# Patient Record
Sex: Male | Born: 1975 | Race: White | Hispanic: No | Marital: Married | Smoking: Never smoker
Health system: Southern US, Community
[De-identification: ages and names within clinical notes are randomized; demographics above are authoritative.]

## PROBLEM LIST (undated history)

## (undated) DIAGNOSIS — K649 Unspecified hemorrhoids: Secondary | ICD-10-CM

## (undated) DIAGNOSIS — F32A Depression, unspecified: Secondary | ICD-10-CM

## (undated) DIAGNOSIS — F329 Major depressive disorder, single episode, unspecified: Secondary | ICD-10-CM

## (undated) DIAGNOSIS — E785 Hyperlipidemia, unspecified: Secondary | ICD-10-CM

## (undated) DIAGNOSIS — J45909 Unspecified asthma, uncomplicated: Secondary | ICD-10-CM

## (undated) DIAGNOSIS — T7840XA Allergy, unspecified, initial encounter: Secondary | ICD-10-CM

## (undated) DIAGNOSIS — K625 Hemorrhage of anus and rectum: Secondary | ICD-10-CM

## (undated) HISTORY — DX: Allergy, unspecified, initial encounter: T78.40XA

## (undated) HISTORY — DX: Unspecified hemorrhoids: K64.9

## (undated) HISTORY — PX: OTHER SURGICAL HISTORY: SHX169

## (undated) HISTORY — PX: WISDOM TOOTH EXTRACTION: SHX21

## (undated) HISTORY — DX: Hyperlipidemia, unspecified: E78.5

## (undated) HISTORY — DX: Depression, unspecified: F32.A

## (undated) HISTORY — DX: Unspecified asthma, uncomplicated: J45.909

## (undated) HISTORY — DX: Hemorrhage of anus and rectum: K62.5

## (undated) HISTORY — DX: Major depressive disorder, single episode, unspecified: F32.9

---

## 2007-08-27 ENCOUNTER — Emergency Department (HOSPITAL_COMMUNITY): Admission: EM | Admit: 2007-08-27 | Discharge: 2007-08-27 | Payer: Self-pay | Admitting: Family Medicine

## 2007-10-25 ENCOUNTER — Emergency Department (HOSPITAL_COMMUNITY): Admission: EM | Admit: 2007-10-25 | Discharge: 2007-10-25 | Payer: Self-pay | Admitting: Family Medicine

## 2008-03-16 ENCOUNTER — Emergency Department (HOSPITAL_COMMUNITY): Admission: EM | Admit: 2008-03-16 | Discharge: 2008-03-16 | Payer: Self-pay | Admitting: Family Medicine

## 2009-01-27 ENCOUNTER — Encounter: Admission: RE | Admit: 2009-01-27 | Discharge: 2009-01-27 | Payer: Self-pay | Admitting: Chiropractic Medicine

## 2010-03-06 ENCOUNTER — Ambulatory Visit: Payer: Self-pay | Admitting: Psychology

## 2011-07-01 LAB — INFLUENZA A AND B ANTIGEN (CONVERTED LAB): Influenza B Ag: NEGATIVE

## 2012-08-14 ENCOUNTER — Ambulatory Visit (INDEPENDENT_AMBULATORY_CARE_PROVIDER_SITE_OTHER): Payer: 59 | Admitting: Psychology

## 2012-08-14 DIAGNOSIS — F411 Generalized anxiety disorder: Secondary | ICD-10-CM

## 2012-08-21 ENCOUNTER — Ambulatory Visit: Payer: Self-pay | Admitting: Psychology

## 2012-08-22 ENCOUNTER — Ambulatory Visit (INDEPENDENT_AMBULATORY_CARE_PROVIDER_SITE_OTHER): Payer: 59 | Admitting: Psychology

## 2012-08-22 DIAGNOSIS — F411 Generalized anxiety disorder: Secondary | ICD-10-CM

## 2012-08-24 ENCOUNTER — Ambulatory Visit (INDEPENDENT_AMBULATORY_CARE_PROVIDER_SITE_OTHER): Payer: 59 | Admitting: Psychology

## 2012-08-24 DIAGNOSIS — F411 Generalized anxiety disorder: Secondary | ICD-10-CM

## 2012-09-13 ENCOUNTER — Ambulatory Visit (INDEPENDENT_AMBULATORY_CARE_PROVIDER_SITE_OTHER): Payer: 59 | Admitting: Psychology

## 2012-09-13 DIAGNOSIS — F411 Generalized anxiety disorder: Secondary | ICD-10-CM

## 2012-09-28 ENCOUNTER — Ambulatory Visit (INDEPENDENT_AMBULATORY_CARE_PROVIDER_SITE_OTHER): Payer: 59 | Admitting: Psychology

## 2012-09-28 DIAGNOSIS — F411 Generalized anxiety disorder: Secondary | ICD-10-CM

## 2012-11-28 ENCOUNTER — Ambulatory Visit (INDEPENDENT_AMBULATORY_CARE_PROVIDER_SITE_OTHER): Payer: 59 | Admitting: Psychology

## 2012-11-28 DIAGNOSIS — F411 Generalized anxiety disorder: Secondary | ICD-10-CM

## 2012-12-19 ENCOUNTER — Ambulatory Visit (INDEPENDENT_AMBULATORY_CARE_PROVIDER_SITE_OTHER): Payer: 59 | Admitting: Psychology

## 2012-12-19 DIAGNOSIS — F411 Generalized anxiety disorder: Secondary | ICD-10-CM

## 2012-12-28 ENCOUNTER — Ambulatory Visit (INDEPENDENT_AMBULATORY_CARE_PROVIDER_SITE_OTHER): Payer: BC Managed Care – PPO | Admitting: Psychology

## 2012-12-28 DIAGNOSIS — F411 Generalized anxiety disorder: Secondary | ICD-10-CM

## 2013-01-05 ENCOUNTER — Ambulatory Visit (INDEPENDENT_AMBULATORY_CARE_PROVIDER_SITE_OTHER): Payer: BC Managed Care – PPO | Admitting: Psychology

## 2013-01-05 DIAGNOSIS — F411 Generalized anxiety disorder: Secondary | ICD-10-CM

## 2013-01-23 ENCOUNTER — Ambulatory Visit (INDEPENDENT_AMBULATORY_CARE_PROVIDER_SITE_OTHER): Payer: 59 | Admitting: Psychology

## 2013-01-23 DIAGNOSIS — F411 Generalized anxiety disorder: Secondary | ICD-10-CM

## 2013-02-20 ENCOUNTER — Ambulatory Visit (INDEPENDENT_AMBULATORY_CARE_PROVIDER_SITE_OTHER): Payer: 59 | Admitting: Psychology

## 2013-02-20 DIAGNOSIS — F411 Generalized anxiety disorder: Secondary | ICD-10-CM

## 2013-02-22 ENCOUNTER — Ambulatory Visit (INDEPENDENT_AMBULATORY_CARE_PROVIDER_SITE_OTHER): Payer: 59 | Admitting: Psychology

## 2013-02-22 DIAGNOSIS — F411 Generalized anxiety disorder: Secondary | ICD-10-CM

## 2013-03-02 ENCOUNTER — Ambulatory Visit: Payer: 59 | Admitting: Psychology

## 2013-03-06 ENCOUNTER — Ambulatory Visit (INDEPENDENT_AMBULATORY_CARE_PROVIDER_SITE_OTHER): Payer: 59 | Admitting: Psychology

## 2013-03-06 DIAGNOSIS — F411 Generalized anxiety disorder: Secondary | ICD-10-CM

## 2013-03-08 ENCOUNTER — Ambulatory Visit: Payer: 59 | Admitting: Psychology

## 2013-05-15 ENCOUNTER — Ambulatory Visit (INDEPENDENT_AMBULATORY_CARE_PROVIDER_SITE_OTHER): Payer: BC Managed Care – PPO | Admitting: Psychology

## 2013-05-15 DIAGNOSIS — F411 Generalized anxiety disorder: Secondary | ICD-10-CM

## 2013-05-23 ENCOUNTER — Ambulatory Visit (INDEPENDENT_AMBULATORY_CARE_PROVIDER_SITE_OTHER): Payer: BC Managed Care – PPO | Admitting: Psychology

## 2013-05-23 DIAGNOSIS — F411 Generalized anxiety disorder: Secondary | ICD-10-CM

## 2013-05-31 ENCOUNTER — Ambulatory Visit (INDEPENDENT_AMBULATORY_CARE_PROVIDER_SITE_OTHER): Payer: BC Managed Care – PPO | Admitting: Psychology

## 2013-05-31 DIAGNOSIS — F411 Generalized anxiety disorder: Secondary | ICD-10-CM

## 2013-06-06 ENCOUNTER — Ambulatory Visit (INDEPENDENT_AMBULATORY_CARE_PROVIDER_SITE_OTHER): Payer: BC Managed Care – PPO | Admitting: Psychology

## 2013-06-06 DIAGNOSIS — F411 Generalized anxiety disorder: Secondary | ICD-10-CM

## 2013-06-13 ENCOUNTER — Ambulatory Visit (INDEPENDENT_AMBULATORY_CARE_PROVIDER_SITE_OTHER): Payer: BC Managed Care – PPO | Admitting: Psychology

## 2013-06-13 DIAGNOSIS — F411 Generalized anxiety disorder: Secondary | ICD-10-CM

## 2013-07-04 ENCOUNTER — Ambulatory Visit (INDEPENDENT_AMBULATORY_CARE_PROVIDER_SITE_OTHER): Payer: BC Managed Care – PPO | Admitting: Psychology

## 2013-07-04 DIAGNOSIS — F411 Generalized anxiety disorder: Secondary | ICD-10-CM

## 2013-07-09 ENCOUNTER — Ambulatory Visit (INDEPENDENT_AMBULATORY_CARE_PROVIDER_SITE_OTHER): Payer: BC Managed Care – PPO | Admitting: Psychology

## 2013-07-09 DIAGNOSIS — F411 Generalized anxiety disorder: Secondary | ICD-10-CM

## 2013-07-18 ENCOUNTER — Ambulatory Visit (INDEPENDENT_AMBULATORY_CARE_PROVIDER_SITE_OTHER): Payer: BC Managed Care – PPO | Admitting: Psychology

## 2013-07-18 DIAGNOSIS — F411 Generalized anxiety disorder: Secondary | ICD-10-CM

## 2013-07-26 ENCOUNTER — Ambulatory Visit (INDEPENDENT_AMBULATORY_CARE_PROVIDER_SITE_OTHER): Payer: BC Managed Care – PPO | Admitting: Psychology

## 2013-07-26 DIAGNOSIS — F411 Generalized anxiety disorder: Secondary | ICD-10-CM

## 2013-09-10 ENCOUNTER — Ambulatory Visit (INDEPENDENT_AMBULATORY_CARE_PROVIDER_SITE_OTHER): Payer: BC Managed Care – PPO | Admitting: Psychology

## 2013-09-10 DIAGNOSIS — F411 Generalized anxiety disorder: Secondary | ICD-10-CM

## 2016-09-13 ENCOUNTER — Encounter (INDEPENDENT_AMBULATORY_CARE_PROVIDER_SITE_OTHER): Payer: Self-pay

## 2016-12-06 DIAGNOSIS — M25551 Pain in right hip: Secondary | ICD-10-CM | POA: Diagnosis not present

## 2016-12-06 DIAGNOSIS — M545 Low back pain: Secondary | ICD-10-CM | POA: Diagnosis not present

## 2017-01-22 DIAGNOSIS — T148XXA Other injury of unspecified body region, initial encounter: Secondary | ICD-10-CM | POA: Diagnosis not present

## 2017-04-14 DIAGNOSIS — R5381 Other malaise: Secondary | ICD-10-CM | POA: Diagnosis not present

## 2017-05-16 ENCOUNTER — Encounter: Payer: Self-pay | Admitting: Neurology

## 2017-05-16 ENCOUNTER — Ambulatory Visit (INDEPENDENT_AMBULATORY_CARE_PROVIDER_SITE_OTHER): Payer: 59 | Admitting: Neurology

## 2017-05-16 VITALS — BP 123/80 | HR 90 | Ht 71.0 in | Wt 179.0 lb

## 2017-05-16 DIAGNOSIS — R0681 Apnea, not elsewhere classified: Secondary | ICD-10-CM | POA: Diagnosis not present

## 2017-05-16 DIAGNOSIS — J4520 Mild intermittent asthma, uncomplicated: Secondary | ICD-10-CM

## 2017-05-16 DIAGNOSIS — G4719 Other hypersomnia: Secondary | ICD-10-CM

## 2017-05-16 DIAGNOSIS — R0683 Snoring: Secondary | ICD-10-CM | POA: Diagnosis not present

## 2017-05-16 DIAGNOSIS — J302 Other seasonal allergic rhinitis: Secondary | ICD-10-CM | POA: Diagnosis not present

## 2017-05-16 NOTE — Progress Notes (Signed)
Subjective:    Patient ID: Kevin Salazar is a 41 y.o. male.  HPI     Star Age, MD, PhD [facility] [address] [place], [place] [address]  Dear Dr. Osborne Casco,   I saw your patient, Kevin Salazar, upon your kind request in my neurologic clinic today for initial consultation of his sleep disorder, in particular, concern for underlying obstructive sleep apnea. The patient is unaccompanied today. As you know, [name] is a 41 year old right-handed gentleman with an underlying medical history of depression, asthma, borderline overweight state, who reports snoring and excessive daytime somnolence. I reviewed your office note from 04/14/2017, which you kindly included. His Epworth sleepiness score is 16 out of 24 today, fatigue score is 37 of 63. He does not typically wake up rested. He lives at home with his wife and 3 children. He owns fitness clubs. He is a nonsmoker, drinks alcohol occasionally, perhaps on the weekend, drinks caffeine in the form of coffee and tea,, on a day-to-day basis typically 1 cup of coffee in the morning and occasional sweet tea. He does not drink sodas. He is not aware of any family history of OSA. He denies any telltale symptoms of restless leg syndrome or leg twitching at night, has been told that he talks in his sleep per wife. She has also witnessed apneic breathing pauses while asleep. His main complaint is daytime somnolence and not waking up rested. He does not have any night to night nocturia or morning headaches. Bedtime is generally around midnight, wakeup time currently around 7:30 AM. He has been known to snore for years per wife's report. His children are 66, 30, and 20 years old. His asthma is generally speaking well controlled, he suffers from seasonal allergies as well.  His Past Medical History Is Significant For: Past Medical History:  Diagnosis Date  . Asthma   . Depression     His Past Surgical History  Is Significant For: No past surgical history on file.  His Family History Is Significant For: Family History  Problem Relation Age of Onset  . Breast cancer Mother     His Social History Is Significant For: Social History   Social History  . Marital status: Married    Spouse name: N/A  . Number of children: N/A  . Years of education: N/A   Social History Main Topics  . Smoking status: Never Smoker  . Smokeless tobacco: Never Used  . Alcohol use No  . Drug use: No  . Sexual activity: Not Asked   Other Topics Concern  . None   Social History Narrative  . None    His Allergies Are:  No Known Allergies:   His Current Medications Are:  Outpatient Encounter Prescriptions as of 05/16/2017  Medication Sig  . albuterol (PROVENTIL HFA;VENTOLIN HFA) 108 (90 Base) MCG/ACT inhaler Inhale into the lungs every 6 (six) hours as needed for wheezing or shortness of breath.  . Azelastine-Fluticasone (DYMISTA) 137-50 MCG/ACT SUSP Place into the nose.  Marland Kitchen desvenlafaxine (PRISTIQ) 50 MG 24 hr tablet Take 50 mg by mouth daily.  . Fluticasone-Salmeterol (ADVAIR) 500-50 MCG/DOSE AEPB Inhale 1 puff into the lungs 2 (two) times daily.   No facility-administered encounter medications on file as of 05/16/2017.   :  Review of Systems:  Out of a complete 14 point review of systems, all are reviewed and negative with the exception of these symptoms as listed below:  Review of Systems  Neurological:  Pt presents today to discuss his sleep. Pt has never had a sleep study but does endorse snoring.  Epworth Sleepiness Scale 0= would never doze 1= slight chance of dozing 2= moderate chance of dozing 3= high chance of dozing  Sitting and reading: 3 Watching TV: 3 Sitting inactive in a public place (ex. Theater or meeting): 3 As a passenger in a car for an hour without a break: 3 Lying down to rest in the afternoon: 3 Sitting and talking to someone: 0 Sitting quietly after lunch (no  alcohol): 1 In a car, while stopped in traffic: 0 Total: 16     Objective:  Neurological Exam  Physical Exam Physical Examination:   Vitals:   05/16/17 1546  BP: 123/80  Pulse: 90    General Examination: The patient is a very pleasant 41 y.o. male in no acute distress. He appears well-developed and well-nourished and well groomed.   HEENT: Normocephalic, atraumatic, pupils are equal, round and reactive to light and accommodation. Funduscopic exam is normal with sharp disc margins noted. Extraocular tracking is good without limitation to gaze excursion or nystagmus noted. Normal smooth pursuit is noted. Hearing is grossly intact. Tympanic membranes are clear bilaterally. Face is symmetric with normal facial animation and normal facial sensation. Speech is clear with no dysarthria noted. There is no hypophonia. There is no lip, neck/head, jaw or voice tremor. Neck is supple with full range of passive and active motion. There are no carotid bruits on auscultation. Oropharynx exam reveals: mild mouth dryness, good dental hygiene and mild airway crowding, due to smaller airway and Redundant soft palate, uvula appears to be relatively speaking a little larger, tonsils are in place but small. Mallampati is class III. Neck circumference is 16-1/2 inches. He has a minimal overbite.  Chest: Clear to auscultation without wheezing, rhonchi or crackles noted.  Heart: S1+S2+0, regular and normal without murmurs, rubs or gallops noted.   Abdomen: Soft, non-tender and non-distended with normal bowel sounds appreciated on auscultation.  Extremities: There is no pitting edema in the distal lower extremities bilaterally. Pedal pulses are intact.  Skin: Warm and dry without trophic changes noted. There are no varicose veins.  Musculoskeletal: exam reveals no obvious joint deformities, tenderness or joint swelling or erythema.   Neurologically:  Mental status: The patient is awake, alert and oriented  in all 4 spheres. His immediate and remote memory, attention, language skills and fund of knowledge are appropriate. There is no evidence of aphasia, agnosia, apraxia or anomia. Speech is clear with normal prosody and enunciation. Thought process is linear. Mood is normal and affect is normal.  Cranial nerves II - XII are as described above under HEENT exam. In addition: shoulder shrug is normal with equal shoulder height noted. Motor exam: Normal bulk, strength and tone is noted. There is no drift, tremor or rebound. Romberg is negative. Reflexes are 1-2+ throughout. Fine motor skills and coordination: intact with normal finger taps, normal hand movements, normal rapid alternating patting, normal foot taps and normal foot agility.  Cerebellar testing: No dysmetria or intention tremor on finger to nose testing. Heel to shin is unremarkable bilaterally. There is no truncal or gait ataxia.  Sensory exam: intact to light touch in the upper and lower extremities.  Gait, station and balance: He stands easily. No veering to one side is noted. No leaning to one side is noted. Posture is age-appropriate and stance is narrow based. Gait shows normal stride length and normal pace. No problems turning  are noted. Tandem walk is unremarkable.  Assessment and Plan:   In summary, JMARION CHRISTIANO is a very pleasant 41 y.o.-year old male  with an underlying medical history of depression, asthma, borderline overweight state, whose history and physical exam are concerning for obstructive sleep apnea (OSA). I had a long chat with the patient about my findings and the diagnosis of OSA, its prognosis and treatment options. We talked about medical treatments, surgical interventions and non-pharmacological approaches. I explained in particular the risks and ramifications of untreated moderate to severe OSA, especially with respect to developing cardiovascular disease down the Road, including congestive heart failure, difficult to  treat hypertension, cardiac arrhythmias, or stroke. Even type 2 diabetes has, in part, been linked to untreated OSA. Symptoms of untreated OSA include daytime sleepiness, memory problems, mood irritability and mood disorder such as depression and anxiety, lack of energy, as well as recurrent headaches, especially morning headaches. We talked about trying to maintain a healthy lifestyle in general, as well as the importance of weight control. I encouraged the patient to eat healthy, exercise daily and keep well hydrated, to keep a scheduled bedtime and wake time routine, to not skip any meals and eat healthy snacks in between meals. I advised the patient not to drive when feeling sleepy. I recommended the following at this time: sleep study with potential positive airway pressure titration. (We will score hypopneas at 4%).   I explained the sleep test procedure to the patient and also outlined possible surgical and non-surgical treatment options of OSA, including the use of a custom-made dental device (which would require a referral to a specialist dentist or oral surgeon), upper airway surgical options, such as pillar implants, radiofrequency surgery, tongue base surgery, and UPPP (which would involve a referral to an ENT surgeon). Rarely, jaw surgery such as mandibular advancement may be considered.  I also explained the CPAP treatment option to the patient, who indicated that he would be willing to try CPAP if the need arises. I explained the importance of being compliant with PAP treatment, not only for insurance purposes but primarily to improve His symptoms, and for the patient's long term health benefit, including to reduce His cardiovascular risks. I answered all his questions today and the patient was in agreement. I would like to see him back after the sleep study is completed and encouraged him to call with any interim questions, concerns, problems or updates.   Thank you very much for allowing me  to participate in the care of this nice patient. If I can be of any further assistance to you please do not hesitate to call me at [phone].  Sincerely,   Star Age, MD, PhD

## 2017-05-16 NOTE — Patient Instructions (Addendum)
Based on your symptoms and your exam I believe you are at risk for obstructive sleep apnea or OSA, and I think we should proceed with a sleep study to determine whether you do or do not have OSA and how severe it is. If you have more than mild OSA, I want you to consider treatment with CPAP. Please remember, the risks and ramifications of moderate to severe obstructive sleep apnea or OSA are: Cardiovascular disease, including congestive heart failure, stroke, difficult to control hypertension, arrhythmias, and even type 2 diabetes has been linked to untreated OSA. Sleep apnea causes disruption of sleep and sleep deprivation in most cases, which, in turn, can cause recurrent headaches, problems with memory, mood, concentration, focus, and vigilance. Most people with untreated sleep apnea report excessive daytime sleepiness, which can affect their ability to drive. Please do not drive if you feel sleepy.   I will likely see you back after your sleep study to go over the test results and where to go from there. We will call you after your sleep study to advise about the results (most likely, you will hear from Diana, my nurse) and to set up an appointment at the time, as necessary.    Our sleep lab administrative assistant, Dawn will meet with you or call you to schedule your sleep study. If you don't hear back from her by about 2 weeks, please feel free to call her at [phone]. This is her direct line and please leave a message with your phone number to call back if you get the voicemail box. She will call back as soon as possible.   

## 2017-05-23 ENCOUNTER — Telehealth: Payer: Self-pay | Admitting: Neurology

## 2017-05-23 DIAGNOSIS — R0681 Apnea, not elsewhere classified: Secondary | ICD-10-CM

## 2017-05-23 NOTE — Telephone Encounter (Signed)
Per SA, ok for HST. Order in EPIC/fim

## 2017-05-23 NOTE — Telephone Encounter (Signed)
[employer] denied Split sleep and approved HST.  Can I get an order for HST? °

## 2017-06-20 ENCOUNTER — Ambulatory Visit (INDEPENDENT_AMBULATORY_CARE_PROVIDER_SITE_OTHER): Payer: 59 | Admitting: Neurology

## 2017-06-20 DIAGNOSIS — G4733 Obstructive sleep apnea (adult) (pediatric): Secondary | ICD-10-CM

## 2017-06-20 DIAGNOSIS — R0681 Apnea, not elsewhere classified: Secondary | ICD-10-CM

## 2017-06-21 DIAGNOSIS — Z0000 Encounter for general adult medical examination without abnormal findings: Secondary | ICD-10-CM | POA: Diagnosis not present

## 2017-06-22 ENCOUNTER — Telehealth: Payer: Self-pay

## 2017-06-22 DIAGNOSIS — H1045 Other chronic allergic conjunctivitis: Secondary | ICD-10-CM | POA: Diagnosis not present

## 2017-06-22 DIAGNOSIS — Z1389 Encounter for screening for other disorder: Secondary | ICD-10-CM | POA: Diagnosis not present

## 2017-06-22 DIAGNOSIS — J4530 Mild persistent asthma, uncomplicated: Secondary | ICD-10-CM | POA: Diagnosis not present

## 2017-06-22 DIAGNOSIS — Z0000 Encounter for general adult medical examination without abnormal findings: Secondary | ICD-10-CM | POA: Diagnosis not present

## 2017-06-22 NOTE — Telephone Encounter (Signed)
Pt returned my call. I advised him that his sleep study revealed mild osa and that Dr. Rexene Alberts recommends that pt start an auto pap to see if he feels better after treatment. Pt reports that he would rather try an oral appliance instead, if Dr. Rexene Alberts is agreeable to this, and will speak to his dentist. Pt asked me to call him back if Dr. Rexene Alberts does not agree with his decision to speak with his dentist regarding an oral appliance. Pt verbalized understanding of results.

## 2017-06-22 NOTE — Telephone Encounter (Signed)
Okay to pursue dental device. Patient can talk to his own dentist first and if needed, if he calls back to request referral to dentistry, I would be happy to do that. Nothing needed at this point. thx

## 2017-06-22 NOTE — Telephone Encounter (Signed)
-----   Message from Star Age, MD sent at 06/22/2017  8:18 AM EDT ----- Patient referred by Dr. Osborne Casco, seen by me on 05/16/17, HST on 06/21/17.     Please call and notify the patient that the recent sleep study did confirm the diagnosis of obstructive sleep apnea. OSA is overall mild, but worth treating to see if he feels better after treatment. To that end I recommend treatment for this in the form of autoPAP, which means, that we don't have to bring him back for an actual sleep study with CPAP, but will let him try an autoPAP machine at home, through a DME company (of his choice, or as per insurance requirement). The DME representative will educate him on how to use the machine, how to put the mask on, etc. I have placed an order in the chart. Please send referral, talk to patient, send report to PCP and referring MD. We will need a FU in sleep clinic for 10 weeks post-PAP set up, please arrange that as well. May see Megan or CM for this too. Thanks,   Star Age, MD, PhD [facility]

## 2017-06-22 NOTE — Procedures (Signed)
  [facility] [address] [place], [place] [address] NAME: Kevin Salazar DOB: [birth year] MEDICAL RECORD [id number] DOS: 06/20/17 REFERRING PHYSICIAN: Richard Tisovec,MD STUDY PERFORMED: Home Sleep Study HISTORY: 41 year old man with a history of depression, asthma, borderline overweight state, who reports snoring and excessive daytime somnolence. Epworth sleepiness score is 16 out of 24, BMI of 24.9. STUDY RESULTS: Total Recording:    6 Hours, 13 Minutes Total Apnea/Hypopnea Index (AHI):  11.2/Hour Average Oxygen Saturation:     93% Lowest Oxygen Saturation:      82%  Time Oxygen Saturation Below 88%:    3% (12 min)  Average Heart Rate:     81 BPM IMPRESSION: OSA RECOMMENDATION: This home sleep test demonstrates overall mild obstructive sleep apnea with a total AHI of 11.2/hour and O2 nadir of 82%. Given the patient's medical history and sleep related complaints, treatment with positive airway pressure is recommended. This can be achieved in the form of autoPAP trial/titration at home. A full night CPAP titration study will help with proper treatment settings and mask fitting, if needed.  Please note that untreated obstructive sleep apnea carries additional perioperative morbidity. Patients with significant obstructive sleep apnea should receive perioperative PAP therapy and the surgeons and particularly the anesthesiologist should be informed of the diagnosis and the severity of the sleep disordered breathing. The patient should be cautioned not to drive, work at heights, or operate dangerous or heavy equipment when tired or sleepy. Review and reiteration of good sleep hygiene measures should be pursued with any patient. Other causes of the patient's symptoms, including circadian rhythm disturbances, an underlying mood disorder, medication effect and/or an underlying medical problem cannot be ruled out based on this test. Clinical correlation is  recommended.   The patient and his referring provider will be notified of the test results. The patient will be seen in follow up in sleep clinic at [facility].  I certify that I have reviewed the raw data recording prior to the issuance of this report in accordance with the standards of the American Academy of Sleep Medicine (AASM).  Star Age, MD, PhD [facility]) Diplomat, ABPN (Neurology and Sleep)

## 2017-06-22 NOTE — Addendum Note (Signed)
Addended by: Star Age on: 06/22/2017 08:18 AM   Modules accepted: Orders

## 2017-06-22 NOTE — Progress Notes (Signed)
Patient referred by Dr. Osborne Casco, seen by me on 05/16/17, HST on 06/21/17.     Please call and notify the patient that the recent sleep study did confirm the diagnosis of obstructive sleep apnea. OSA is overall mild, but worth treating to see if he feels better after treatment. To that end I recommend treatment for this in the form of autoPAP, which means, that we don't have to bring him back for an actual sleep study with CPAP, but will let him try an autoPAP machine at home, through a DME company (of his choice, or as per insurance requirement). The DME representative will educate him on how to use the machine, how to put the mask on, etc. I have placed an order in the chart. Please send referral, talk to patient, send report to PCP and referring MD. We will need a FU in sleep clinic for 10 weeks post-PAP set up, please arrange that as well. May see Megan or CM for this too. Thanks,   Star Age, MD, PhD [facility])

## 2017-06-22 NOTE — Telephone Encounter (Signed)
I called pt to discuss his sleep study results. No answer, left message asking him to call me back.

## 2018-01-30 ENCOUNTER — Ambulatory Visit (INDEPENDENT_AMBULATORY_CARE_PROVIDER_SITE_OTHER): Payer: 59 | Admitting: Psychology

## 2018-01-30 DIAGNOSIS — F411 Generalized anxiety disorder: Secondary | ICD-10-CM | POA: Diagnosis not present

## 2018-02-14 ENCOUNTER — Ambulatory Visit: Payer: 59 | Admitting: Psychology

## 2018-02-21 ENCOUNTER — Ambulatory Visit (INDEPENDENT_AMBULATORY_CARE_PROVIDER_SITE_OTHER): Payer: 59 | Admitting: Psychology

## 2018-02-21 DIAGNOSIS — F411 Generalized anxiety disorder: Secondary | ICD-10-CM

## 2018-03-01 ENCOUNTER — Ambulatory Visit: Payer: 59 | Admitting: Psychology

## 2018-03-07 ENCOUNTER — Ambulatory Visit: Payer: Self-pay | Admitting: Psychology

## 2018-04-17 ENCOUNTER — Telehealth: Payer: Self-pay | Admitting: Gastroenterology

## 2018-04-17 NOTE — Telephone Encounter (Signed)
Left message on machine to call back  

## 2018-04-17 NOTE — Telephone Encounter (Signed)
Kevin Salazar, He's been having 3 years of rectal bleeding, banding procedures with [facility] in the past. His mother was just diagnosed with colon cancer, metastatic.   He needs a colonoscopy [facility], looks like I have time Wednesday July 31st at 11:30 (only 6 cases are scheduled starting at 8:30, I should have 7 spots starting at 8:30) for FH colon cancer, rectal bleeding. If this is just all from hemorrhoidal bleeding I will refer him to one of the [facility] that does hemorrhoid banding procedures after the colonoscopy.  Thanks  His cell is [phone]

## 2018-04-17 NOTE — Telephone Encounter (Signed)
The pt returned call and was set up for previsit and colon.

## 2018-04-26 ENCOUNTER — Ambulatory Visit (AMBULATORY_SURGERY_CENTER): Payer: Self-pay | Admitting: *Deleted

## 2018-04-26 VITALS — Ht 71.0 in | Wt 178.0 lb

## 2018-04-26 DIAGNOSIS — Z800 Family history of malignant neoplasm of digestive organs: Secondary | ICD-10-CM

## 2018-04-26 DIAGNOSIS — K625 Hemorrhage of anus and rectum: Secondary | ICD-10-CM

## 2018-04-26 MED ORDER — PEG 3350-KCL-NA BICARB-NACL 420 G PO SOLR: 4000.0000 mL | Freq: Once | ORAL | 0 refills | Status: AC

## 2018-04-26 NOTE — Progress Notes (Signed)
No egg or soy allergy known to patient  No issues with past sedation with any surgeries  or procedures, no intubation problems  No diet pills per patient No home 02 use per patient  No blood thinners per patient  Pt denies issues with constipation  No A fib or A flutter  EMMI video sent to pt's e mail - pt declined  Pt had many questions about his prep and his colon- all questions asked and encouraged pt to call with questions-

## 2018-05-03 ENCOUNTER — Encounter: Payer: Self-pay | Admitting: Gastroenterology

## 2018-05-03 ENCOUNTER — Ambulatory Visit (AMBULATORY_SURGERY_CENTER): Payer: 59 | Admitting: Gastroenterology

## 2018-05-03 VITALS — BP 102/62 | HR 59 | Temp 98.2°F | Resp 19 | Ht 71.0 in | Wt 178.0 lb

## 2018-05-03 DIAGNOSIS — D124 Benign neoplasm of descending colon: Secondary | ICD-10-CM

## 2018-05-03 DIAGNOSIS — Z800 Family history of malignant neoplasm of digestive organs: Secondary | ICD-10-CM | POA: Diagnosis not present

## 2018-05-03 DIAGNOSIS — Z1211 Encounter for screening for malignant neoplasm of colon: Secondary | ICD-10-CM

## 2018-05-03 DIAGNOSIS — K625 Hemorrhage of anus and rectum: Secondary | ICD-10-CM

## 2018-05-03 DIAGNOSIS — K5730 Diverticulosis of large intestine without perforation or abscess without bleeding: Secondary | ICD-10-CM

## 2018-05-03 DIAGNOSIS — K649 Unspecified hemorrhoids: Secondary | ICD-10-CM

## 2018-05-03 MED ORDER — SODIUM CHLORIDE 0.9 % IV SOLN: 500.0000 mL | Freq: Once | INTRAVENOUS | Status: DC

## 2018-05-03 NOTE — Op Note (Signed)
[facility] Patient Name: Kevin Salazar Procedure Date: 05/03/2018 10:54 AM MRN: [id number] Endoscopist: Milus Banister , MD Age: 42 Referring MD:  Date of Birth: [birth year] Gender: Male Account #: [id number] Procedure:                Colonoscopy Indications:              Screening in patient at increased risk: Family                            history of 1st-degree relative with colorectal                            cancer (mother diagnosed with metastatic colon                            cancer in her 49s) Medicines:                Monitored Anesthesia Care Procedure:                Pre-Anesthesia Assessment:                           - Prior to the procedure, a History and Physical                            was performed, and patient medications and                            allergies were reviewed. The patient's tolerance of                            previous anesthesia was also reviewed. The risks                            and benefits of the procedure and the sedation                            options and risks were discussed with the patient.                            All questions were answered, and informed consent                            was obtained. Prior Anticoagulants: The patient has                            taken no previous anticoagulant or antiplatelet                            agents. ASA Grade Assessment: II - A patient with                            mild systemic disease. After reviewing the risks  and benefits, the patient was deemed in                            satisfactory condition to undergo the procedure.                           After obtaining informed consent, the colonoscope                            was passed under direct vision. Throughout the                            procedure, the patient's blood pressure, pulse, and                            oxygen saturations were monitored continuously. The                             Colonoscope was introduced through the anus and                            advanced to the the cecum, identified by                            appendiceal orifice and ileocecal valve. The                            colonoscopy was performed without difficulty. The                            patient tolerated the procedure well. The quality                            of the bowel preparation was excellent. The                            ileocecal valve, appendiceal orifice, and rectum                            were photographed. Scope In: 11:07:03 AM Scope Out: 11:20:39 AM Scope Withdrawal Time: 0 hours 9 minutes 49 seconds  Total Procedure Duration: 0 hours 13 minutes 36 seconds  Findings:                 A 5 mm polyp was found in the descending colon. The                            polyp was sessile. The polyp was removed with a                            cold snare. Resection and retrieval were complete.                           Multiple small-mouthed diverticula were found in  the left colon.                           External and internal hemorrhoids were found. The                            hemorrhoids were small.                           The exam was otherwise without abnormality on                            direct and retroflexion views. Complications:            No immediate complications. Estimated blood loss:                            None. Estimated Blood Loss:     Estimated blood loss: none. Impression:               - One 5 mm polyp in the descending colon, removed                            with a cold snare. Resected and retrieved.                           - Diverticulosis in the left colon.                           - External and internal hemorrhoids.                           - The examination was otherwise normal on direct                            and retroflexion views. Recommendation:           - Patient  has a contact number available for                            emergencies. The signs and symptoms of potential                            delayed complications were discussed with the                            patient. Return to normal activities tomorrow.                            Written discharge instructions were provided to the                            patient.                           - Resume previous diet.                           -  Continue present medications.                           You will receive a letter within 2-3 weeks with the                            pathology results and my final recommendations.                           If the polyp(s) is proven to be 'pre-cancerous' on                            pathology, you will need repeat colonoscopy in 5                            years. Milus Banister, MD 05/03/2018 11:24:06 AM This report has been signed electronically.

## 2018-05-03 NOTE — Progress Notes (Signed)
Report to PACU, RN, vss, BBS= Clear.  

## 2018-05-03 NOTE — Patient Instructions (Addendum)
Thank you for allowing [place] to care for you today!  Await pathology results by mail, 2-3 weeks.  Handouts given for polyps, hemorrhoids, and diverticulosis.  Follow-up colonoscopy dependent on pathology results (possibly 5 years).     YOU HAD AN ENDOSCOPIC PROCEDURE TODAY AT [facility]:   Refer to the procedure report that was given to you for any specific questions about what was found during the examination.  If the procedure report does not answer your questions, please call your gastroenterologist to clarify.  If you requested that your care partner not be given the details of your procedure findings, then the procedure report has been included in a sealed envelope for you to review at your convenience later.  YOU SHOULD EXPECT: Some feelings of bloating in the abdomen. Passage of more gas than usual.  Walking can help get rid of the air that was put into your GI tract during the procedure and reduce the bloating. If you had a lower endoscopy (such as a colonoscopy or flexible sigmoidoscopy) you may notice spotting of blood in your stool or on the toilet paper. If you underwent a bowel prep for your procedure, you may not have a normal bowel movement for a few days.  Please Note:  You might notice some irritation and congestion in your nose or some drainage.  This is from the oxygen used during your procedure.  There is no need for concern and it should clear up in a day or so.  SYMPTOMS TO REPORT IMMEDIATELY:   Following lower endoscopy (colonoscopy or flexible sigmoidoscopy):  Excessive amounts of blood in the stool  Significant tenderness or worsening of abdominal pains  Swelling of the abdomen that is new, acute  Fever of 100F or higher   For urgent or emergent issues, a gastroenterologist can be reached at any hour by calling [phone].   DIET:  We do recommend a small meal at first, but then you may proceed to your regular diet.  Drink plenty of fluids  but you should avoid alcoholic beverages for 24 hours.  ACTIVITY:  You should plan to take it easy for the rest of today and you should NOT DRIVE or use heavy machinery until tomorrow (because of the sedation medicines used during the test).    FOLLOW UP: Our staff will call the number listed on your records the next business day following your procedure to check on you and address any questions or concerns that you may have regarding the information given to you following your procedure. If we do not reach you, we will leave a message.  However, if you are feeling well and you are not experiencing any problems, there is no need to return our call.  We will assume that you have returned to your regular daily activities without incident.  If any biopsies were taken you will be contacted by phone or by letter within the next 1-3 weeks.  Please call us at [phone] if you have not heard about the biopsies in 3 weeks.    SIGNATURES/CONFIDENTIALITY: You and/or your care partner have signed paperwork which will be entered into your electronic medical record.  These signatures attest to the fact that that the information above on your After Visit Summary has been reviewed and is understood.  Full responsibility of the confidentiality of this discharge information lies with you and/or your care-partner.

## 2018-05-03 NOTE — Progress Notes (Signed)
Called to room to assist during endoscopic procedure.  Patient ID and intended procedure confirmed with present staff. Received instructions for my participation in the procedure from the performing physician.  

## 2018-05-03 NOTE — Progress Notes (Signed)
Pt's states no medical or surgical changes since previsit or office visit. 

## 2018-05-04 ENCOUNTER — Telehealth: Payer: Self-pay

## 2018-05-04 NOTE — Telephone Encounter (Signed)
Attempted to reach pt. With follow-up call following endoscopic procedure 05/03/2018.   LM On pt. Voice mail, will try to reach pt. Again later today.

## 2018-05-04 NOTE — Telephone Encounter (Signed)
  Follow up Call-  Call back number 05/03/2018  Post procedure Call Back phone  # [phone]  Permission to leave phone message Yes  Some recent data might be hidden     Patient questions:  Do you have a fever, pain , or abdominal swelling? No. Pain Score  0 *  Have you tolerated food without any problems? Yes.    Have you been able to return to your normal activities? Yes.    Do you have any questions about your discharge instructions: Diet   No. Medications  No. Follow up visit  No.  Do you have questions or concerns about your Care? No.  Actions: * If pain score is 4 or above: No action needed, pain <4.

## 2018-05-09 ENCOUNTER — Encounter: Payer: Self-pay | Admitting: Gastroenterology

## 2018-09-04 ENCOUNTER — Ambulatory Visit (INDEPENDENT_AMBULATORY_CARE_PROVIDER_SITE_OTHER): Payer: 59 | Admitting: Psychology

## 2018-09-04 DIAGNOSIS — F411 Generalized anxiety disorder: Secondary | ICD-10-CM

## 2018-09-06 DIAGNOSIS — Z0000 Encounter for general adult medical examination without abnormal findings: Secondary | ICD-10-CM | POA: Diagnosis not present

## 2018-09-06 DIAGNOSIS — R82998 Other abnormal findings in urine: Secondary | ICD-10-CM | POA: Diagnosis not present

## 2018-09-13 DIAGNOSIS — G4733 Obstructive sleep apnea (adult) (pediatric): Secondary | ICD-10-CM | POA: Diagnosis not present

## 2018-09-13 DIAGNOSIS — D126 Benign neoplasm of colon, unspecified: Secondary | ICD-10-CM | POA: Diagnosis not present

## 2018-09-13 DIAGNOSIS — Z1389 Encounter for screening for other disorder: Secondary | ICD-10-CM | POA: Diagnosis not present

## 2018-09-13 DIAGNOSIS — E7800 Pure hypercholesterolemia, unspecified: Secondary | ICD-10-CM | POA: Diagnosis not present

## 2018-09-13 DIAGNOSIS — Z0000 Encounter for general adult medical examination without abnormal findings: Secondary | ICD-10-CM | POA: Diagnosis not present

## 2018-10-30 DIAGNOSIS — D171 Benign lipomatous neoplasm of skin and subcutaneous tissue of trunk: Secondary | ICD-10-CM | POA: Diagnosis not present

## 2019-11-02 ENCOUNTER — Ambulatory Visit: Payer: 59 | Attending: Internal Medicine

## 2019-11-02 DIAGNOSIS — Z20822 Contact with and (suspected) exposure to covid-19: Secondary | ICD-10-CM

## 2019-11-03 LAB — NOVEL CORONAVIRUS, NAA: SARS-CoV-2, NAA: NOT DETECTED

## 2019-11-05 ENCOUNTER — Ambulatory Visit: Payer: 59 | Attending: Internal Medicine

## 2019-11-05 DIAGNOSIS — Z20822 Contact with and (suspected) exposure to covid-19: Secondary | ICD-10-CM

## 2019-11-06 LAB — NOVEL CORONAVIRUS, NAA: SARS-CoV-2, NAA: NOT DETECTED

## 2020-02-21 ENCOUNTER — Ambulatory Visit: Payer: 59 | Admitting: Psychology

## 2020-03-26 ENCOUNTER — Ambulatory Visit (INDEPENDENT_AMBULATORY_CARE_PROVIDER_SITE_OTHER): Payer: 59 | Admitting: Psychology

## 2020-03-26 DIAGNOSIS — F4322 Adjustment disorder with anxiety: Secondary | ICD-10-CM

## 2020-04-03 ENCOUNTER — Ambulatory Visit (INDEPENDENT_AMBULATORY_CARE_PROVIDER_SITE_OTHER): Payer: 59 | Admitting: Psychology

## 2020-04-03 DIAGNOSIS — F4322 Adjustment disorder with anxiety: Secondary | ICD-10-CM | POA: Diagnosis not present

## 2020-05-15 ENCOUNTER — Other Ambulatory Visit: Payer: Self-pay

## 2020-05-15 ENCOUNTER — Emergency Department (HOSPITAL_COMMUNITY)
Admission: EM | Admit: 2020-05-15 | Discharge: 2020-05-16 | Disposition: A | Discharge status: Home / Self Care | Payer: 59 | Attending: Emergency Medicine | Admitting: Emergency Medicine

## 2020-05-15 DIAGNOSIS — U071 COVID-19: Secondary | ICD-10-CM | POA: Diagnosis not present

## 2020-05-15 DIAGNOSIS — J45909 Unspecified asthma, uncomplicated: Secondary | ICD-10-CM | POA: Diagnosis not present

## 2020-05-15 DIAGNOSIS — G479 Sleep disorder, unspecified: Secondary | ICD-10-CM | POA: Insufficient documentation

## 2020-05-15 DIAGNOSIS — R531 Weakness: Secondary | ICD-10-CM | POA: Diagnosis not present

## 2020-05-15 DIAGNOSIS — Z20822 Contact with and (suspected) exposure to covid-19: Secondary | ICD-10-CM | POA: Diagnosis present

## 2020-05-15 DIAGNOSIS — Z79899 Other long term (current) drug therapy: Secondary | ICD-10-CM | POA: Insufficient documentation

## 2020-05-16 ENCOUNTER — Encounter (HOSPITAL_COMMUNITY): Payer: Self-pay | Admitting: Emergency Medicine

## 2020-05-16 ENCOUNTER — Emergency Department (HOSPITAL_COMMUNITY): Payer: 59

## 2020-05-16 LAB — BASIC METABOLIC PANEL
Anion gap: 10 (ref 5–15)
BUN: 12 mg/dL (ref 6–20)
CO2: 25 mmol/L (ref 22–32)
Calcium: 8.5 mg/dL — ABNORMAL LOW (ref 8.9–10.3)
Chloride: 102 mmol/L (ref 98–111)
Creatinine, Ser: 1.01 mg/dL (ref 0.61–1.24)
GFR calc Af Amer: >60 mL/min (ref >60)
GFR calc non Af Amer: >60 mL/min (ref >60)
Glucose, Bld: 114 mg/dL — ABNORMAL HIGH (ref 70–99)
Potassium: 3.8 mmol/L (ref 3.5–5.1)
Sodium: 137 mmol/L (ref 135–145)

## 2020-05-16 LAB — POC SARS CORONAVIRUS 2 AG -  ED: SARS Coronavirus 2 Ag: POSITIVE — AB

## 2020-05-16 MED ORDER — FAMOTIDINE IN NACL 20-0.9 MG/50ML-% IV SOLN: 20.0000 mg | Freq: Once | INTRAVENOUS | Status: DC | PRN

## 2020-05-16 MED ORDER — DIPHENHYDRAMINE HCL 50 MG/ML IJ SOLN: 50.0000 mg | Freq: Once | INTRAMUSCULAR | Status: DC | PRN

## 2020-05-16 MED ORDER — SODIUM CHLORIDE 0.9 % IV SOLN: INTRAVENOUS | Status: DC | PRN

## 2020-05-16 MED ORDER — CASIRIVIMAB-IMDEVIMAB 600-600 MG/10ML IJ SOLN
1200.0000 mg | Freq: Once | INTRAMUSCULAR | Status: AC
  Administered 2020-05-16: 1200 mg via INTRAVENOUS
  Filled 2020-05-16: qty 1200

## 2020-05-16 MED ORDER — EPINEPHRINE 0.3 MG/0.3ML IJ SOAJ: 0.3000 mg | Freq: Once | INTRAMUSCULAR | Status: DC | PRN

## 2020-05-16 MED ORDER — METHYLPREDNISOLONE SODIUM SUCC 125 MG IJ SOLR: 125.0000 mg | Freq: Once | INTRAMUSCULAR | Status: DC | PRN

## 2020-05-16 MED ORDER — ALBUTEROL SULFATE HFA 108 (90 BASE) MCG/ACT IN AERS: 2.0000 | INHALATION_SPRAY | Freq: Once | RESPIRATORY_TRACT | Status: DC | PRN

## 2020-05-16 NOTE — ED Triage Notes (Signed)
Patient complaining of feeling dehydrated. Patient states he can not sleep and feels tired. Patient is covid positive.

## 2020-05-16 NOTE — ED Provider Notes (Signed)
[facility] DEPT Provider Note   CSN: [id number] Arrival date & time: 05/15/20  2034     History Chief Complaint  Patient presents with  . Covid Exposure    covid positive    Kevin Salazar is a 44 y.o. male.  Patient with history of asthma, unvaccinated, known COVID + after daughter returned home from camp with known COVID exposure, presents with generalized weakness, cough, fever, insomnia for the past 6 days. He denies increased use of his albuterol but is taking Advair twice daily where normally he takes in QD. He denies productive cough but there is post-tussive "bile like" emesis. He is drinking lots of water and gatorade. He is urinating per his usual. He voices concern for dehydration. He reports his pulse oximetry at home has remained 93-98%.  The history is provided by the patient. No language interpreter was used.       Past Medical History:  Diagnosis Date  . Allergy   . Asthma   . Depression   . Hemorrhoids   . Rectal bleeding     There are no problems to display for this patient.   Past Surgical History:  Procedure Laterality Date  . cyst removed fron knee     7th grade   . WISDOM TOOTH EXTRACTION         Family History  Problem Relation Age of Onset  . Breast cancer Mother   . Colon cancer Mother   . Colon polyps Neg Hx   . Esophageal cancer Neg Hx   . Rectal cancer Neg Hx   . Stomach cancer Neg Hx     Social History   Tobacco Use  . Smoking status: Never Smoker  . Smokeless tobacco: Former Network engineer  . Vaping Use: Never used  Substance Use Topics  . Alcohol use: Yes    Comment: social drinker   . Drug use: No    Home Medications Prior to Admission medications   Medication Sig Start Date End Date Taking? Authorizing Provider  acetaminophen (TYLENOL) 500 MG tablet Take 1,000 mg by mouth every 6 (six) hours as needed for mild pain.   Yes [provider]  albuterol (PROVENTIL  HFA;VENTOLIN HFA) 108 (90 Base) MCG/ACT inhaler Inhale into the lungs every 6 (six) hours as needed for wheezing or shortness of breath.   Yes [provider]  atorvastatin (LIPITOR) 10 MG tablet Take 10 mg by mouth daily. 03/25/20  Yes [provider]  Fluticasone-Salmeterol (ADVAIR) 500-50 MCG/DOSE AEPB Inhale 1 puff into the lungs 2 (two) times daily.   Yes [provider]  TRINTELLIX 10 MG TABS tablet Take 10 mg by mouth daily. 05/05/20  Yes [provider]    Allergies    Patient has no known allergies.  Review of Systems   Review of Systems  Constitutional: Positive for fatigue and fever. Negative for chills.  HENT: Negative.   Respiratory: Positive for cough. Negative for shortness of breath and wheezing.   Cardiovascular: Negative.  Negative for chest pain.  Gastrointestinal: Negative.  Negative for abdominal pain and nausea.  Genitourinary: Negative for decreased urine volume.  Musculoskeletal: Negative.   Skin: Negative.   Neurological: Positive for weakness.  Psychiatric/Behavioral: Positive for sleep disturbance.    Physical Exam Updated Vital Signs BP 114/73 (BP Location: Left Arm)   Pulse 92   Temp 99.5 F (37.5 C) (Oral)   Resp 18   Ht 5\' 11"  (1.803 m)  Wt 81.6 kg   SpO2 97%   BMI 25.10 kg/m   Physical Exam Vitals and nursing note reviewed.  Constitutional:      General: He is not in acute distress.    Appearance: Normal appearance. He is well-developed.  HENT:     Head: Normocephalic.     Mouth/Throat:     Mouth: Mucous membranes are moist.  Cardiovascular:     Rate and Rhythm: Normal rate and regular rhythm.     Heart sounds: No murmur heard.   Pulmonary:     Effort: Pulmonary effort is normal.     Breath sounds: Normal breath sounds. No wheezing, rhonchi or rales.  Abdominal:     General: Bowel sounds are normal.     Palpations: Abdomen is soft.     Tenderness: There is no abdominal tenderness. There is no  guarding or rebound.  Musculoskeletal:        General: Normal range of motion.     Cervical back: Normal range of motion and neck supple.  Skin:    General: Skin is warm and dry.     Findings: No rash.  Neurological:     Mental Status: He is alert and oriented to person, place, and time.     ED Results / Procedures / Treatments   Labs (all labs ordered are listed, but only abnormal results are displayed) Labs Reviewed  BASIC METABOLIC PANEL    EKG None  Radiology No results found.  Procedures Procedures (including critical care time)  Medications Ordered in ED Medications  casirivimab-imdevimab (REGEN-COV) 1,200 mg in sodium chloride 0.9 % 110 mL IVPB (has no administration in time range)  0.9 %  sodium chloride infusion (has no administration in time range)  diphenhydrAMINE (BENADRYL) injection 50 mg (has no administration in time range)  famotidine (PEPCID) IVPB 20 mg premix (has no administration in time range)  methylPREDNISolone sodium succinate (SOLU-MEDROL) 125 mg/2 mL injection 125 mg (has no administration in time range)  albuterol (VENTOLIN HFA) 108 (90 Base) MCG/ACT inhaler 2 puff (has no administration in time range)  EPINEPHrine (EPI-PEN) injection 0.3 mg (has no administration in time range)    ED Course  I have reviewed the triage vital signs and the nursing notes.  Pertinent labs & imaging results that were available during my care of the patient were reviewed by me and considered in my medical decision making (see chart for details).    MDM Rules/Calculators/A&P                          Well appearing patient with known COVID infection, diagnoses 9 days ago while asymptomatic, with symptom onset 2 days after diagnosis as detailed in the HPI.   No hypoxia, VSS. He is overall very well appearing. No evidence to suggest dehydration. He qualifies for monoclonal antibody infusion which has been ordered. Will observe.   He has received monoclonal  infusion and has been observed appropriately with no adverse affects. CXR clear. No hypoxia. He is appropriate for discharge home.   Final Clinical Impression(s) / ED Diagnoses Final diagnoses:  None   COVID+ Insomnia   Rx / DC Orders ED Discharge Orders    None       Dennie Bible 05/16/20 0308    Ezequiel Essex, MD 05/16/20 [phone]

## 2020-05-16 NOTE — Discharge Instructions (Addendum)
Continue your regular medications as prescribed. Return to the emergency department if you develop any shortness of breath, chest pain or new concern.   Follow up with your doctor for recheck in one week if still symptomatic.   Recommend Benadryl 25-50 mg before bed to aid sleep.

## 2021-06-28 IMAGING — DX DG CHEST 1V
1 series · 1 of 1 positions shown · non-contrast
Comparison: None.

CLINICAL DATA: 44-year-old male positive [id number].  Dehydrated.

EXAM:
CHEST  1 VIEW

[chest ap]
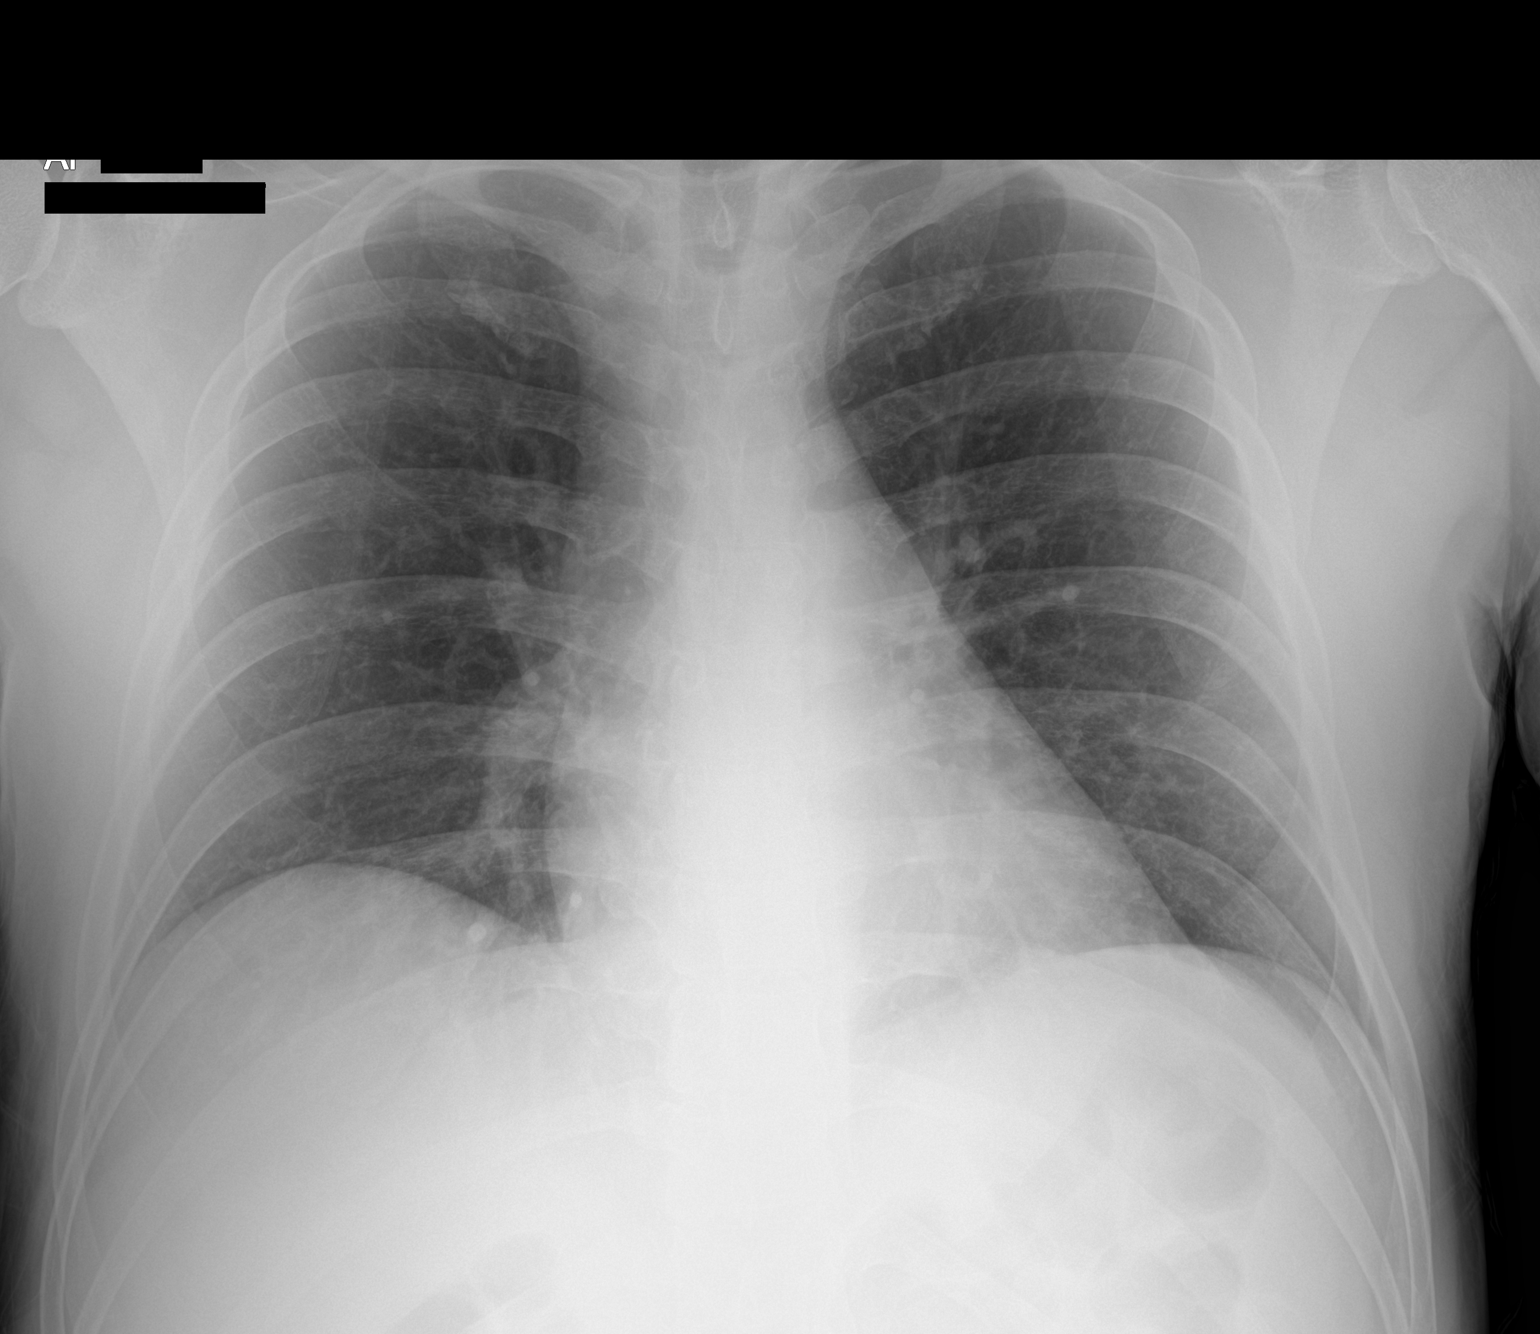

[1 of 1 positions shown; findings below may reference images not displayed]

FINDINGS: Portable AP semi upright view at 6327 hours. Normal cardiac size and
mediastinal contours. Low normal lung volumes. Visualized tracheal
air column is within normal limits. Allowing for portable technique
the lungs are clear. No pneumothorax or pleural effusion. No osseous
abnormality identified. Negative visible bowel gas pattern.
IMPRESSION: Negative portable chest.

## 2021-09-30 DIAGNOSIS — E7800 Pure hypercholesterolemia, unspecified: Secondary | ICD-10-CM | POA: Diagnosis not present

## 2021-09-30 DIAGNOSIS — Z125 Encounter for screening for malignant neoplasm of prostate: Secondary | ICD-10-CM | POA: Diagnosis not present

## 2021-10-01 DIAGNOSIS — E7800 Pure hypercholesterolemia, unspecified: Secondary | ICD-10-CM | POA: Diagnosis not present

## 2021-10-06 DIAGNOSIS — Z1331 Encounter for screening for depression: Secondary | ICD-10-CM | POA: Diagnosis not present

## 2021-10-06 DIAGNOSIS — Z1339 Encounter for screening examination for other mental health and behavioral disorders: Secondary | ICD-10-CM | POA: Diagnosis not present

## 2021-10-06 DIAGNOSIS — E7800 Pure hypercholesterolemia, unspecified: Secondary | ICD-10-CM | POA: Diagnosis not present

## 2021-10-06 DIAGNOSIS — R82998 Other abnormal findings in urine: Secondary | ICD-10-CM | POA: Diagnosis not present

## 2021-10-06 DIAGNOSIS — Z0000 Encounter for general adult medical examination without abnormal findings: Secondary | ICD-10-CM | POA: Diagnosis not present

## 2022-09-15 DIAGNOSIS — F4320 Adjustment disorder, unspecified: Secondary | ICD-10-CM | POA: Diagnosis not present

## 2022-09-29 DIAGNOSIS — F4320 Adjustment disorder, unspecified: Secondary | ICD-10-CM | POA: Diagnosis not present

## 2022-10-07 DIAGNOSIS — E7800 Pure hypercholesterolemia, unspecified: Secondary | ICD-10-CM | POA: Diagnosis not present

## 2022-10-07 DIAGNOSIS — Z125 Encounter for screening for malignant neoplasm of prostate: Secondary | ICD-10-CM | POA: Diagnosis not present

## 2022-10-13 DIAGNOSIS — Z0000 Encounter for general adult medical examination without abnormal findings: Secondary | ICD-10-CM | POA: Diagnosis not present

## 2022-10-13 DIAGNOSIS — E7800 Pure hypercholesterolemia, unspecified: Secondary | ICD-10-CM | POA: Diagnosis not present

## 2022-10-13 DIAGNOSIS — Z1339 Encounter for screening examination for other mental health and behavioral disorders: Secondary | ICD-10-CM | POA: Diagnosis not present

## 2022-10-13 DIAGNOSIS — Z1331 Encounter for screening for depression: Secondary | ICD-10-CM | POA: Diagnosis not present

## 2022-10-13 DIAGNOSIS — R82998 Other abnormal findings in urine: Secondary | ICD-10-CM | POA: Diagnosis not present

## 2022-10-14 DIAGNOSIS — F4320 Adjustment disorder, unspecified: Secondary | ICD-10-CM | POA: Diagnosis not present

## 2022-11-11 DIAGNOSIS — F4320 Adjustment disorder, unspecified: Secondary | ICD-10-CM | POA: Diagnosis not present

## 2023-05-10 DIAGNOSIS — H9312 Tinnitus, left ear: Secondary | ICD-10-CM | POA: Diagnosis not present

## 2023-06-23 ENCOUNTER — Encounter: Payer: Self-pay | Admitting: Gastroenterology

## 2023-07-04 DIAGNOSIS — J342 Deviated nasal septum: Secondary | ICD-10-CM | POA: Diagnosis not present

## 2023-07-04 DIAGNOSIS — H903 Sensorineural hearing loss, bilateral: Secondary | ICD-10-CM | POA: Diagnosis not present

## 2023-07-04 DIAGNOSIS — H9312 Tinnitus, left ear: Secondary | ICD-10-CM | POA: Diagnosis not present

## 2023-09-29 ENCOUNTER — Ambulatory Visit (INDEPENDENT_AMBULATORY_CARE_PROVIDER_SITE_OTHER): Payer: BC Managed Care – PPO | Admitting: Gastroenterology

## 2023-09-29 ENCOUNTER — Encounter: Payer: Self-pay | Admitting: Gastroenterology

## 2023-09-29 VITALS — BP 102/66 | HR 66 | Ht 71.0 in | Wt 177.1 lb

## 2023-09-29 DIAGNOSIS — K625 Hemorrhage of anus and rectum: Secondary | ICD-10-CM

## 2023-09-29 DIAGNOSIS — K649 Unspecified hemorrhoids: Secondary | ICD-10-CM | POA: Insufficient documentation

## 2023-09-29 DIAGNOSIS — Z860101 Personal history of adenomatous and serrated colon polyps: Secondary | ICD-10-CM | POA: Diagnosis not present

## 2023-09-29 DIAGNOSIS — R151 Fecal smearing: Secondary | ICD-10-CM | POA: Diagnosis not present

## 2023-09-29 DIAGNOSIS — Z800 Family history of malignant neoplasm of digestive organs: Secondary | ICD-10-CM

## 2023-09-29 MED ORDER — NA SULFATE-K SULFATE-MG SULF 17.5-3.13-1.6 GM/177ML PO SOLN: 1.0000 | ORAL | 0 refills | Status: AC

## 2023-09-29 MED ORDER — HYDROCORTISONE ACETATE 25 MG RE SUPP: 25.0000 mg | Freq: Two times a day (BID) | RECTAL | 1 refills | Status: AC

## 2023-09-29 NOTE — Patient Instructions (Addendum)
We have sent the following medications to your pharmacy for you to pick up at your convenience: Suprep, Anusol Suppositories   Use Anusol Suppositories nightly for hemorrhoids.   Please purchase the following medications over the counter and take as directed: Start Benefiber OR Metamucil 1-2 times daily.   You have been scheduled for a colonoscopy. Please follow written instructions given to you at your visit today.   Please pick up your prep supplies at the pharmacy within the next 1-3 days.  If you use inhalers (even only as needed), please bring them with you on the day of your procedure.  DO NOT TAKE 7 DAYS PRIOR TO TEST- Trulicity (dulaglutide) Ozempic, Wegovy (semaglutide) Mounjaro (tirzepatide) Bydureon Bcise (exanatide extended release)  DO NOT TAKE 1 DAY PRIOR TO YOUR TEST Rybelsus (semaglutide) Adlyxin (lixisenatide) Victoza (liraglutide) Byetta (exanatide) ___________________________________________________________________________  Due to recent changes in healthcare laws, you may see the results of your imaging and laboratory studies on MyChart before your provider has had a chance to review them.  We understand that in some cases there may be results that are confusing or concerning to you. Not all laboratory results come back in the same time frame and the provider may be waiting for multiple results in order to interpret others.  Please give [place] 48 hours in order for your provider to thoroughly review all the results before contacting the office for clarification of your results.   _______________________________________________________  If your blood pressure at your visit was 140/90 or greater, please contact your primary care physician to follow up on this.  _______________________________________________________  If you are age 46 or older, your body mass index should be between 23-30. Your Body mass index is 24.7 kg/m. If this is out of the aforementioned range  listed, please consider follow up with your Primary Care Provider.  If you are age 46 or younger, your body mass index should be between 19-25. Your Body mass index is 24.7 kg/m. If this is out of the aformentioned range listed, please consider follow up with your Primary Care Provider.   ________________________________________________________  The [facility] providers would like to encourage you to use [facility] to communicate with providers for non-urgent requests or questions.  Due to long hold times on the telephone, sending your provider a message by [facility] may be a faster and more efficient way to get a response.  Please allow 48 business hours for a response.  Please remember that this is for non-urgent requests.  _______________________________________________________  Thank you for choosing me and [facility].  Dr. Meridee Score

## 2023-09-29 NOTE — Progress Notes (Signed)
GASTROENTEROLOGY OUTPATIENT CLINIC VISIT   Primary Care Provider Tisovec, Adelfa Koh, MD [address] [place] [address] [phone]  Referring Provider Tisovec, Adelfa Koh, MD [address] [place],  [place] [address] [phone]  Patient Profile: Kevin Salazar is a 47 y.o. male with a pmh significant for asthma, MDD, hyperlipidemia, colon polyps (TA's), hemorrhoids, family history colon cancer (mother).  The patient presents to the [facility] for an evaluation and management of problem(s) noted below:  Problem List 1. Hemorrhoids, unspecified hemorrhoid type   2. Fecal soiling   3. Bright red blood per rectum   4. Hx of adenomatous colonic polyps   5. Family history of colon cancer     History of Present Illness This is the patient's first visit to the outpatient [place] [facility] clinic in years.  He was last seen by my partner Dr. Christella Hartigan for a colonoscopy.  Internal/external hemorrhoids were found and tubular adenoma was found.  He was recommended a 5-year follow-up based on previous guidelines.  Patient describes issues over the course of the last few years of progressive fecal leakage of fluid which he believes is a result of his external hemorrhoidal disease.  He has noted bright red blood on the toilet paper at times and this is rare.  He passes 1 formed, hard, bowel movement daily.  He does not strain.  He knows he is overdue for colonoscopy at this time.  He is interested in moving forward with colonoscopy.  He is not use Preparation H.  He is not use any fiber supplementations.  GI Review of Systems Positive as above Negative for pyrosis, dysphagia, odynophagia, nausea, vomiting, abdominal pain, melena  Review of Systems General: Denies fevers/chills/weight loss unintentionally Cardiovascular: Denies chest pain Pulmonary: Denies shortness of breath Gastroenterological: See HPI Genitourinary: Denies darkened urine Hematological: Denies easy  bruising/bleeding Dermatological: Denies jaundice Psychological: Mood is stable   Medications Current Outpatient Medications  Medication Sig Dispense Refill   acetaminophen (TYLENOL) 500 MG tablet Take 1,000 mg by mouth every 6 (six) hours as needed for mild pain.     albuterol (PROVENTIL HFA;VENTOLIN HFA) 108 (90 Base) MCG/ACT inhaler Inhale into the lungs every 6 (six) hours as needed for wheezing or shortness of breath.     atorvastatin (LIPITOR) 10 MG tablet Take 10 mg by mouth daily.     fluticasone (FLONASE) 50 MCG/ACT nasal spray Place 1 spray into both nostrils daily.     fluticasone-salmeterol (ADVAIR) 100-50 MCG/ACT AEPB Inhale 1 puff into the lungs daily.     Fluticasone-Salmeterol (ADVAIR) 500-50 MCG/DOSE AEPB Inhale 1 puff into the lungs 2 (two) times daily.     hydrocortisone (ANUSOL-HC) 25 MG suppository Place 1 suppository (25 mg total) rectally every 12 (twelve) hours. 12 suppository 1   Na Sulfate-K Sulfate-Mg Sulf (SUPREP BOWEL PREP KIT) 17.5-3.13-1.6 GM/177ML SOLN Take 1 kit by mouth as directed. For colonoscopy prep 354 mL 0   TRINTELLIX 10 MG TABS tablet Take 10 mg by mouth daily.     No current facility-administered medications for this visit.    Allergies No Known Allergies  Histories Past Medical History:  Diagnosis Date   Allergy    Asthma    Depression    Hemorrhoids    Rectal bleeding    Past Surgical History:  Procedure Laterality Date   cyst removed fron knee     7th grade    WISDOM TOOTH EXTRACTION     Social History   Socioeconomic History  Marital status: Married    Spouse name: Not on file   Number of children: Not on file   Years of education: Not on file   Highest education level: Not on file  Occupational History   Not on file  Tobacco Use   Smoking status: Never   Smokeless tobacco: Former  Advertising account planner   Vaping status: Never Used  Substance and Sexual Activity   Alcohol use: Yes    Comment: social drinker    Drug use: No    Sexual activity: Not on file  Other Topics Concern   Not on file  Social History Narrative   Not on file   Social Drivers of Health   Financial Resource Strain: Not on file  Food Insecurity: Low Risk  (07/04/2023)   Received from [facility]   Hunger Vital Sign    Worried About Running Out of Food in the Last Year: Never true    Ran Out of Food in the Last Year: Never true  Transportation Needs: No Transportation Needs (07/04/2023)   Received from [facility]    In the past 12 months, has lack of reliable transportation kept you from medical appointments, meetings, work or from getting things needed for daily living? : No  Physical Activity: Not on file  Stress: Not on file  Social Connections: Unknown (02/16/2022)   Received from [facility]   Social Network    Social Network: Not on file  Intimate Partner Violence: Unknown (01/08/2022)   Received from [facility]   HITS    Physically Hurt: Not on file    Insult or Talk Down To: Not on file    Threaten Physical Harm: Not on file    Scream or Curse: Not on file   Family History  Problem Relation Age of Onset   Breast cancer Mother    Colon cancer Mother    Colon polyps Neg Hx    Esophageal cancer Neg Hx    Rectal cancer Neg Hx    Stomach cancer Neg Hx    Inflammatory bowel disease Neg Hx    Liver disease Neg Hx    Pancreatic cancer Neg Hx    I have reviewed his medical, social, and family history in detail and updated the electronic medical record as necessary.    PHYSICAL EXAMINATION  BP 102/66   Pulse 66   Ht 5\' 11"  (1.803 m)   Wt 177 lb 2 oz (80.3 kg)   SpO2 97%   BMI 24.70 kg/m  Wt Readings from Last 3 Encounters:  09/29/23 177 lb 2 oz (80.3 kg)  05/16/20 180 lb (81.6 kg)  05/03/18 178 lb (80.7 kg)  GEN: NAD, appears stated age, doesn't appear chronically ill PSYCH: Cooperative, without pressured speech EYE: Conjunctivae pink, sclerae anicteric ENT: MMM CV:  Nontachycardic RESP: No audible wheezing GI: NABS, soft, NT/ND, without rebound or guarding GU: Perianal exam shows evidence of inflamed external hemorrhoid tags; digital rectal exam suggest internal hemorrhoids without other palpable lesions and no anal fissures MSK/EXT: No significant lower extremity edema SKIN: No jaundice NEURO:  Alert & Oriented x 3, no focal deficits   REVIEW OF DATA  I reviewed the following data at the time of this encounter:  GI Procedures and Studies  2019 colonoscopy - One 5 mm polyp in the descending colon, removed with a cold snare. Resected and retrieved. - Diverticulosis in the left colon. - External and internal hemorrhoids. - The examination was otherwise  normal on direct and retroflexion views.  Laboratory Studies  Reviewed those in epic  Imaging Studies  No relevant studies to review   ASSESSMENT  Mr. Talbott is a 47 y.o. male with a pmh significant for asthma, MDD, hyperlipidemia, colon polyps (TA's), hemorrhoids, family history colon cancer (mother).  The patient is seen today for evaluation and management of:  1. Hemorrhoids, unspecified hemorrhoid type   2. Fecal soiling   3. Bright red blood per rectum   4. Hx of adenomatous colonic polyps   5. Family history of colon cancer    The patient is hemodynamically stable.  Clinically, his history as well as his physical exam suggest that his external hemorrhoids.  Although fecal leakage/soilage is not always an indication for hemorrhoidal resection, based on how significantly inflamed the external hemorrhoids are, I do think he would benefit from consideration of surgical management.  I will get him up-to-date from a colon cancer screening and colon polyp standpoint by pursuing colonoscopy.  He should continue on a 5-year colonoscopy recall plan, otherwise.  Will place a referral to colorectal surgery today.  I have advocated to try to bulk the stool to see if that will help with some of his symptoms,  though is not clear to me based on his external hemorrhoidal disease that this will be helpful.  Will give him a round of Anusol suppositories to see if that may be helpful as well.  If it is he can let us know via MyChart and we can send in further prescriptions/refills.  The risks and benefits of endoscopic evaluation were discussed with the patient; these include but are not limited to the risk of perforation, infection, bleeding, missed lesions, lack of diagnosis, severe illness requiring hospitalization, as well as anesthesia and sedation related illnesses.  The patient and/or family is agreeable to proceed.  All patient questions were answered to the best of my ability, and the patient agrees to the aforementioned plan of action with follow-up as indicated.   PLAN  Proceed with scheduling surveillance colonoscopy Anusol suppositories nightly - If helpful after initial prescription can send in a separate if he MyChart me Preparation H may be used as needed Anusol suppositories nightly x 12 - If helpful patient will relay to [place] and we can place refill request Referral to colorectal surgery to consider external hemorrhoidal therapy/surgery Initiate Metamucil or Benefiber powder once to twice daily in an effort of trying to bulk stools to see if this will improve symptomatology   Orders Placed This Encounter  Procedures   Ambulatory referral to Gastroenterology   Ambulatory referral to General Surgery    New Prescriptions   HYDROCORTISONE (ANUSOL-HC) 25 MG SUPPOSITORY    Place 1 suppository (25 mg total) rectally every 12 (twelve) hours.   NA SULFATE-K SULFATE-MG SULF (SUPREP BOWEL PREP KIT) 17.5-3.13-1.6 GM/177ML SOLN    Take 1 kit by mouth as directed. For colonoscopy prep   Modified Medications   No medications on file    Planned Follow Up No follow-ups on file.   Total Time in Face-to-Face and in Coordination of Care for patient including independent/personal  interpretation/review of prior testing, medical history, examination, medication adjustment, communicating results with the patient directly, and documentation within the EHR is 45 minutes.   Corliss Parish, MD [facility] Office # [phone]

## 2023-10-25 DIAGNOSIS — Z125 Encounter for screening for malignant neoplasm of prostate: Secondary | ICD-10-CM | POA: Diagnosis not present

## 2023-10-25 DIAGNOSIS — E7800 Pure hypercholesterolemia, unspecified: Secondary | ICD-10-CM | POA: Diagnosis not present

## 2023-11-01 DIAGNOSIS — Z0000 Encounter for general adult medical examination without abnormal findings: Secondary | ICD-10-CM | POA: Diagnosis not present

## 2023-11-01 DIAGNOSIS — R7301 Impaired fasting glucose: Secondary | ICD-10-CM | POA: Diagnosis not present

## 2023-11-01 DIAGNOSIS — Z1331 Encounter for screening for depression: Secondary | ICD-10-CM | POA: Diagnosis not present

## 2023-11-01 DIAGNOSIS — E7800 Pure hypercholesterolemia, unspecified: Secondary | ICD-10-CM | POA: Diagnosis not present

## 2023-11-01 DIAGNOSIS — Z1339 Encounter for screening examination for other mental health and behavioral disorders: Secondary | ICD-10-CM | POA: Diagnosis not present

## 2023-12-06 ENCOUNTER — Telehealth: Payer: Self-pay | Admitting: Gastroenterology

## 2023-12-06 ENCOUNTER — Encounter: Payer: Self-pay | Admitting: Gastroenterology

## 2023-12-06 NOTE — Telephone Encounter (Signed)
 Inbound call from patient stating  that he received a letter in his Mychart regarding how his colonoscopy on 3/11 at 7:30 with Dr. Meridee Score was going to be considered. Patient is requesting a call to discuss further. Please advise.

## 2023-12-11 ENCOUNTER — Other Ambulatory Visit: Payer: Self-pay | Admitting: Gastroenterology

## 2023-12-13 ENCOUNTER — Encounter: Payer: Self-pay | Admitting: Gastroenterology

## 2023-12-13 ENCOUNTER — Ambulatory Visit: Payer: BC Managed Care – PPO | Admitting: Gastroenterology

## 2023-12-13 VITALS — BP 122/52 | HR 65 | Temp 98.2°F | Resp 13 | Ht 71.0 in | Wt 177.2 lb

## 2023-12-13 DIAGNOSIS — Z800 Family history of malignant neoplasm of digestive organs: Secondary | ICD-10-CM | POA: Diagnosis not present

## 2023-12-13 DIAGNOSIS — D127 Benign neoplasm of rectosigmoid junction: Secondary | ICD-10-CM

## 2023-12-13 DIAGNOSIS — K635 Polyp of colon: Secondary | ICD-10-CM | POA: Diagnosis not present

## 2023-12-13 DIAGNOSIS — D122 Benign neoplasm of ascending colon: Secondary | ICD-10-CM | POA: Diagnosis not present

## 2023-12-13 DIAGNOSIS — K5730 Diverticulosis of large intestine without perforation or abscess without bleeding: Secondary | ICD-10-CM | POA: Diagnosis not present

## 2023-12-13 DIAGNOSIS — K642 Third degree hemorrhoids: Secondary | ICD-10-CM

## 2023-12-13 DIAGNOSIS — Z860101 Personal history of adenomatous and serrated colon polyps: Secondary | ICD-10-CM

## 2023-12-13 DIAGNOSIS — Z1211 Encounter for screening for malignant neoplasm of colon: Secondary | ICD-10-CM

## 2023-12-13 MED ORDER — HYDROCORTISONE ACETATE 25 MG RE SUPP: 25.0000 mg | Freq: Every evening | RECTAL | 2 refills | Status: AC

## 2023-12-13 MED ORDER — SODIUM CHLORIDE 0.9 % IV SOLN: 500.0000 mL | Freq: Once | INTRAVENOUS | Status: DC

## 2023-12-13 NOTE — Progress Notes (Signed)
 Vss nad trans to pacu

## 2023-12-13 NOTE — Progress Notes (Signed)
 Called to room to assist during endoscopic procedure.  Patient ID and intended procedure confirmed with present staff. Received instructions for my participation in the procedure from the performing physician.

## 2023-12-13 NOTE — Progress Notes (Signed)
 GASTROENTEROLOGY PROCEDURE H&P NOTE   Primary Care Physician: Tisovec, Adelfa Koh, MD  HPI: Kevin Salazar is a 48 y.o. male who presents for Colonoscopy for surveillance of prior adenomas and FHx of Colon Cancer.  Past Medical History:  Diagnosis Date   Allergy    Asthma    Depression    Hemorrhoids    Rectal bleeding    Past Surgical History:  Procedure Laterality Date   cyst removed fron knee     7th grade    WISDOM TOOTH EXTRACTION     Current Outpatient Medications  Medication Sig Dispense Refill   acetaminophen (TYLENOL) 500 MG tablet Take 1,000 mg by mouth every 6 (six) hours as needed for mild pain.     albuterol (PROVENTIL HFA;VENTOLIN HFA) 108 (90 Base) MCG/ACT inhaler Inhale into the lungs every 6 (six) hours as needed for wheezing or shortness of breath.     atorvastatin (LIPITOR) 10 MG tablet Take 10 mg by mouth daily.     fluticasone (FLONASE) 50 MCG/ACT nasal spray Place 1 spray into both nostrils daily.     fluticasone-salmeterol (ADVAIR) 100-50 MCG/ACT AEPB Inhale 1 puff into the lungs daily.     Fluticasone-Salmeterol (ADVAIR) 500-50 MCG/DOSE AEPB Inhale 1 puff into the lungs 2 (two) times daily.     hydrocortisone (ANUSOL-HC) 25 MG suppository Place 1 suppository (25 mg total) rectally every 12 (twelve) hours. 12 suppository 1   Na Sulfate-K Sulfate-Mg Sulf (SUPREP BOWEL PREP KIT) 17.5-3.13-1.6 GM/177ML SOLN Take 1 kit by mouth as directed. For colonoscopy prep 354 mL 0   TRINTELLIX 10 MG TABS tablet Take 10 mg by mouth daily.     No current facility-administered medications for this visit.    Current Outpatient Medications:    acetaminophen (TYLENOL) 500 MG tablet, Take 1,000 mg by mouth every 6 (six) hours as needed for mild pain., Disp: , Rfl:    albuterol (PROVENTIL HFA;VENTOLIN HFA) 108 (90 Base) MCG/ACT inhaler, Inhale into the lungs every 6 (six) hours as needed for wheezing or shortness of breath., Disp: , Rfl:    atorvastatin (LIPITOR) 10 MG  tablet, Take 10 mg by mouth daily., Disp: , Rfl:    fluticasone (FLONASE) 50 MCG/ACT nasal spray, Place 1 spray into both nostrils daily., Disp: , Rfl:    fluticasone-salmeterol (ADVAIR) 100-50 MCG/ACT AEPB, Inhale 1 puff into the lungs daily., Disp: , Rfl:    Fluticasone-Salmeterol (ADVAIR) 500-50 MCG/DOSE AEPB, Inhale 1 puff into the lungs 2 (two) times daily., Disp: , Rfl:    hydrocortisone (ANUSOL-HC) 25 MG suppository, Place 1 suppository (25 mg total) rectally every 12 (twelve) hours., Disp: 12 suppository, Rfl: 1   Na Sulfate-K Sulfate-Mg Sulf (SUPREP BOWEL PREP KIT) 17.5-3.13-1.6 GM/177ML SOLN, Take 1 kit by mouth as directed. For colonoscopy prep, Disp: 354 mL, Rfl: 0   TRINTELLIX 10 MG TABS tablet, Take 10 mg by mouth daily., Disp: , Rfl:  No Known Allergies Family History  Problem Relation Age of Onset   Breast cancer Mother    Colon cancer Mother    Colon polyps Neg Hx    Esophageal cancer Neg Hx    Rectal cancer Neg Hx    Stomach cancer Neg Hx    Inflammatory bowel disease Neg Hx    Liver disease Neg Hx    Pancreatic cancer Neg Hx    Social History   Socioeconomic History   Marital status: Married    Spouse name: Not on file   Number of  children: Not on file   Years of education: Not on file   Highest education level: Not on file  Occupational History   Not on file  Tobacco Use   Smoking status: Never   Smokeless tobacco: Former  Advertising account planner   Vaping status: Never Used  Substance and Sexual Activity   Alcohol use: Yes    Comment: social drinker    Drug use: No   Sexual activity: Not on file  Other Topics Concern   Not on file  Social History Narrative   Not on file   Social Drivers of Health   Financial Resource Strain: Not on file  Food Insecurity: Low Risk  (07/04/2023)   Received from [facility]   Hunger Vital Sign    Worried About Running Out of Food in the Last Year: Never true    Ran Out of Food in the Last Year: Never true  Transportation  Needs: No Transportation Needs (07/04/2023)   Received from [facility]    In the past 12 months, has lack of reliable transportation kept you from medical appointments, meetings, work or from getting things needed for daily living? : No  Physical Activity: Not on file  Stress: Not on file  Social Connections: Unknown (02/16/2022)   [facility]   Social Network    Social Network: Not on file  Intimate Partner Violence: Unknown (01/08/2022)   Received from [facility]   HITS    Physically Hurt: Not on file    Insult or Talk Down To: Not on file    Threaten Physical Harm: Not on file    Scream or Curse: Not on file    Physical Exam: There were no vitals filed for this visit. There is no height or weight on file to calculate BMI. GEN: NAD EYE: Sclerae anicteric ENT: MMM CV: Non-tachycardic GI: Soft, NT/ND NEURO:  Alert & Oriented x 3  Lab Results: No results for input(s): "WBC", "HGB", "HCT", "PLT" in the last 72 hours. BMET No results for input(s): "NA", "K", "CL", "CO2", "GLUCOSE", "BUN", "CREATININE", "CALCIUM" in the last 72 hours. LFT No results for input(s): "PROT", "ALBUMIN", "AST", "ALT", "ALKPHOS", "BILITOT", "BILIDIR", "IBILI" in the last 72 hours. PT/INR No results for input(s): "LABPROT", "INR" in the last 72 hours.   Impression / Plan: This is a 48 y.o.male who presents for Colonoscopy for surveillance of prior adenomas and FHx of Colon Cancer.  The risks and benefits of endoscopic evaluation/treatment were discussed with the patient and/or family; these include but are not limited to the risk of perforation, infection, bleeding, missed lesions, lack of diagnosis, severe illness requiring hospitalization, as well as anesthesia and sedation related illnesses.  The patient's history has been reviewed, patient examined, no change in status, and deemed stable for procedure.  The patient and/or family is agreeable to proceed.     Corliss Parish, MD [facility] Office # [phone]

## 2023-12-13 NOTE — Patient Instructions (Addendum)
 -Handout on polyps, hemorrhoids, fiber diet and diverticulosis provided -await pathology results -repeat colonoscopy for surveillance recommended. Date to be determined when pathology result become available   -Continue present medications -Use FiberCon 1-2 tablets by mouth daily -High fiber diet recommended -Use Anusol suppository nightly for 2 weeks, try Good Rx if trouble with insurance covering. If still unable to get, use over the counter PreparationH suppositories.  -Contact Dr. Meridee Score in 2 weeks via MyChart to let him know how you are doing  YOU HAD AN ENDOSCOPIC PROCEDURE TODAY AT THE [facility]:   Refer to the procedure report that was given to you for any specific questions about what was found during the examination.  If the procedure report does not answer your questions, please call your gastroenterologist to clarify.  If you requested that your care partner not be given the details of your procedure findings, then the procedure report has been included in a sealed envelope for you to review at your convenience later.  YOU SHOULD EXPECT: Some feelings of bloating in the abdomen. Passage of more gas than usual.  Walking can help get rid of the air that was put into your GI tract during the procedure and reduce the bloating. If you had a lower endoscopy (such as a colonoscopy or flexible sigmoidoscopy) you may notice spotting of blood in your stool or on the toilet paper. If you underwent a bowel prep for your procedure, you may not have a normal bowel movement for a few days.  Please Note:  You might notice some irritation and congestion in your nose or some drainage.  This is from the oxygen used during your procedure.  There is no need for concern and it should clear up in a day or so.  SYMPTOMS TO REPORT IMMEDIATELY:  Following lower endoscopy (colonoscopy or flexible sigmoidoscopy):  Excessive amounts of blood in the stool  Significant tenderness or worsening of  abdominal pains  Swelling of the abdomen that is new, acute  Fever of 100F or higher  For urgent or emergent issues, a gastroenterologist can be reached at any hour by calling [phone]. Do not use MyChart messaging for urgent concerns.    DIET:  We do recommend a small meal at first, but then you may proceed to your regular diet.  Drink plenty of fluids but you should avoid alcoholic beverages for 24 hours.  ACTIVITY:  You should plan to take it easy for the rest of today and you should NOT DRIVE or use heavy machinery until tomorrow (because of the sedation medicines used during the test).    FOLLOW UP: Our staff will call the number listed on your records the next business day following your procedure.  We will call around 7:15- 8:00 am to check on you and address any questions or concerns that you may have regarding the information given to you following your procedure. If we do not reach you, we will leave a message.     If any biopsies were taken you will be contacted by phone or by letter within the next 1-3 weeks.  Please call us at [phone] if you have not heard about the biopsies in 3 weeks.    SIGNATURES/CONFIDENTIALITY: You and/or your care partner have signed paperwork which will be entered into your electronic medical record.  These signatures attest to the fact that that the information above on your After Visit Summary has been reviewed and is understood.  Full responsibility of the  confidentiality of this discharge information lies with you and/or your care-partner.

## 2023-12-13 NOTE — Op Note (Signed)
 [facility] Patient Name: Kevin Salazar Procedure Date: 12/13/2023 8:13 AM MRN: [id number] Endoscopist: Corliss Parish , MD, [id number] Age: 48 Referring MD:  Date of Birth: 11-Nov-1975 Gender: Male Account #: [id number] Procedure:                Colonoscopy Indications:              Screening in patient at increased risk: Family                            history of 1st-degree relative with colorectal                            cancer before age 69 years, Surveillance: Personal                            history of adenomatous polyps on last colonoscopy 5                            years ago Medicines:                Monitored Anesthesia Care Procedure:                Pre-Anesthesia Assessment:                           - Prior to the procedure, a History and Physical                            was performed, and patient medications and                            allergies were reviewed. The patient's tolerance of                            previous anesthesia was also reviewed. The risks                            and benefits of the procedure and the sedation                            options and risks were discussed with the patient.                            All questions were answered, and informed consent                            was obtained. Prior Anticoagulants: The patient has                            taken no anticoagulant or antiplatelet agents. ASA                            Grade Assessment: II - A patient with mild systemic  disease. After reviewing the risks and benefits,                            the patient was deemed in satisfactory condition to                            undergo the procedure.                           After obtaining informed consent, the colonoscope                            was passed under direct vision. Throughout the                            procedure, the patient's blood pressure, pulse, and                             oxygen saturations were monitored continuously. The                            CF [id number] #[id number] was introduced through the anus                            and advanced to the the cecum, identified by                            appendiceal orifice and ileocecal valve. The                            colonoscopy was performed without difficulty. The                            patient tolerated the procedure. The quality of the                            bowel preparation was adequate. The ileocecal                            valve, appendiceal orifice, and rectum were                            photographed. Scope In: 8:44:09 AM Scope Out: 9:03:15 AM Scope Withdrawal Time: 0 hours 14 minutes 35 seconds  Total Procedure Duration: 0 hours 19 minutes 6 seconds  Findings:                 The digital rectal exam findings include                            hemorrhoids. Pertinent negatives include no                            palpable rectal lesions.  Many medium-mouthed and small-mouthed diverticula                            were found in the entire colon.                           Five sessile polyps were found in the recto-sigmoid                            colon (3) and ascending colon (2). The polyps were                            2 to 4 mm in size. These polyps were removed with a                            cold snare. Resection and retrieval were complete.                           Normal mucosa was found in the entire colon                            otherwise.                           Non-bleeding non-thrombosed external and internal                            hemorrhoids were found during retroflexion, during                            perianal exam and during digital exam. The                            hemorrhoids were Grade III (internal hemorrhoids                            that prolapse but require manual  reduction). Complications:            No immediate complications. Estimated Blood Loss:     Estimated blood loss was minimal. Impression:               - Hemorrhoids found on digital rectal exam.                           - Diverticulosis in the entire examined colon.                           - Five 2 to 4 mm polyps at the recto-sigmoid colon                            and in the ascending colon, removed with a cold                            snare. Resected and retrieved.                           -  Normal mucosa in the entire examined colon                            otherwise.                           - Non-bleeding non-thrombosed external and internal                            hemorrhoids. Recommendation:           - The patient will be observed post-procedure,                            until all discharge criteria are met.                           - Discharge patient to home.                           - Patient has a contact number available for                            emergencies. The signs and symptoms of potential                            delayed complications were discussed with the                            patient. Return to normal activities tomorrow.                            Written discharge instructions were provided to the                            patient.                           - High fiber diet.                           - Use FiberCon 1-2 tablets PO daily.                           - Continue present medications.                           - Await pathology results.                           - Follow-up in regards to patient's hemorrhoidal                            bleeding and follow-up with colorectal surgery, not                            clear hemorrhoidal banding will be enough for this  patient..                           - Repeat colonoscopy based on pathology results.                            Likely 3 to 5  years.                           - The findings and recommendations were discussed                            with the patient.                           - The findings and recommendations were discussed                            with the patient's family. Corliss Parish, MD 12/13/2023 9:13:42 AM

## 2023-12-14 ENCOUNTER — Telehealth: Payer: Self-pay | Admitting: *Deleted

## 2023-12-14 NOTE — Telephone Encounter (Signed)
 Left message on f/u call

## 2023-12-16 ENCOUNTER — Encounter: Payer: Self-pay | Admitting: Gastroenterology

## 2023-12-16 LAB — SURGICAL PATHOLOGY

## 2023-12-19 ENCOUNTER — Other Ambulatory Visit: Payer: Self-pay

## 2023-12-19 DIAGNOSIS — K649 Unspecified hemorrhoids: Secondary | ICD-10-CM

## 2024-02-01 DIAGNOSIS — K643 Fourth degree hemorrhoids: Secondary | ICD-10-CM | POA: Diagnosis not present
